# Patient Record
Sex: Male | Born: 1980 | Race: White | Hispanic: No | Marital: Single | Smoking: Never smoker
Health system: Southern US, Community
[De-identification: ages and names within clinical notes are randomized; demographics above are authoritative.]

## PROBLEM LIST (undated history)

## (undated) DIAGNOSIS — F909 Attention-deficit hyperactivity disorder, unspecified type: Secondary | ICD-10-CM

## (undated) DIAGNOSIS — M5136 Other intervertebral disc degeneration, lumbar region: Secondary | ICD-10-CM

## (undated) DIAGNOSIS — M51369 Other intervertebral disc degeneration, lumbar region without mention of lumbar back pain or lower extremity pain: Secondary | ICD-10-CM

## (undated) HISTORY — PX: TONSILLECTOMY: SHX5217

## (undated) HISTORY — DX: Attention-deficit hyperactivity disorder, unspecified type: F90.9

## (undated) HISTORY — DX: Other intervertebral disc degeneration, lumbar region without mention of lumbar back pain or lower extremity pain: M51.369

## (undated) HISTORY — DX: Other intervertebral disc degeneration, lumbar region: M51.36

## (undated) HISTORY — PX: SHOULDER ARTHROSCOPY: SHX128

---

## 2009-02-17 ENCOUNTER — Emergency Department: Payer: Self-pay | Admitting: Emergency Medicine

## 2009-08-24 ENCOUNTER — Emergency Department: Payer: Self-pay | Admitting: Emergency Medicine

## 2010-09-20 IMAGING — US ABDOMEN ULTRASOUND
1 series · 17 of 25 positions shown · non-contrast
Comparison: none

REASON FOR EXAM: EPIGASTRIC PAIN
COMMENTS:

PROCEDURE:     US  - US ABDOMEN GENERAL SURVEY  - August 24, 2009  [DATE]
RESULT:     Comparison: None
TECHNIQUE: Multiple gray-scale and color-flow Doppler images of the abdomen
are presented for review.

[Series 1: abdomen ultrasound · 17 of 57 slices shown]
[im 1/57]
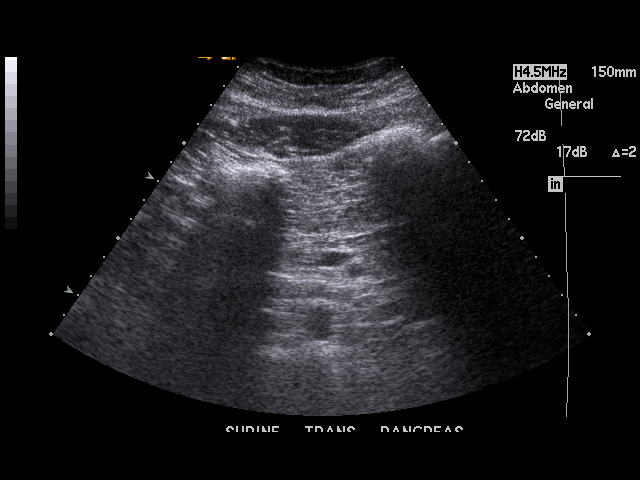
[im 5/57]
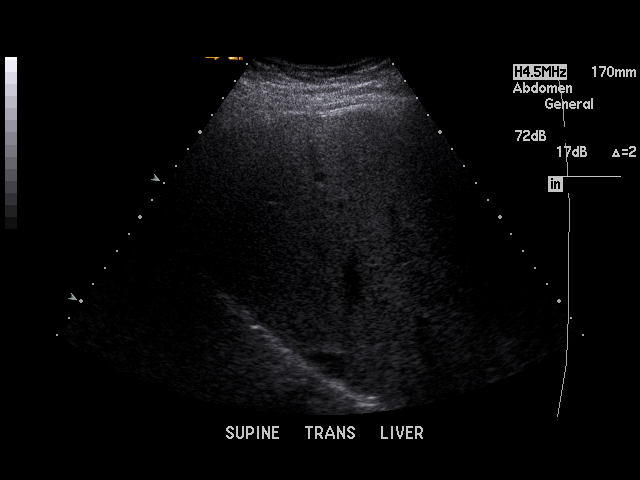
[im 8/57]
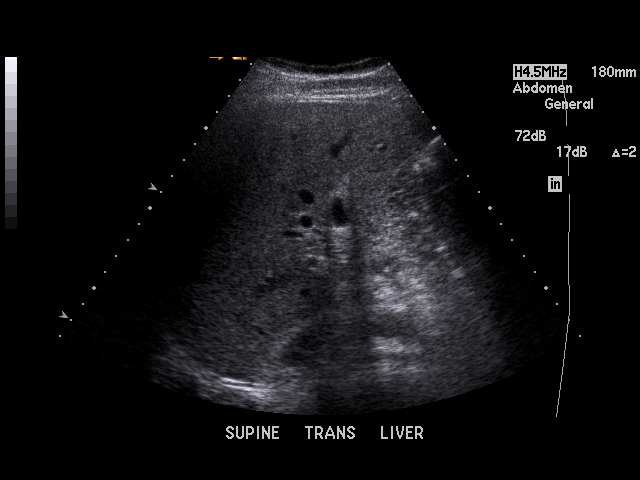
[im 12/57]
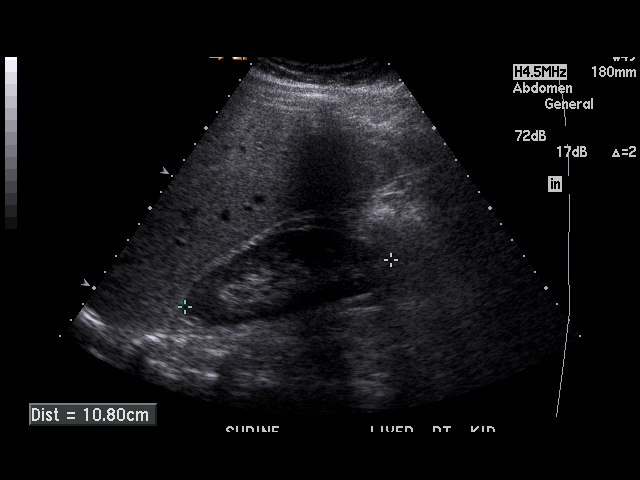
[im 15/57]
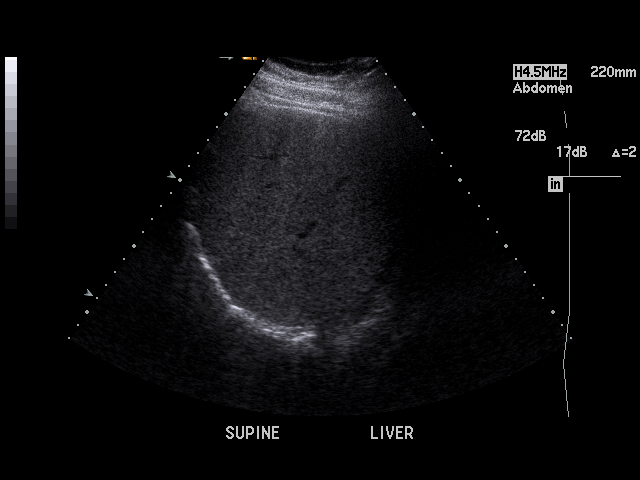
[im 19/57]
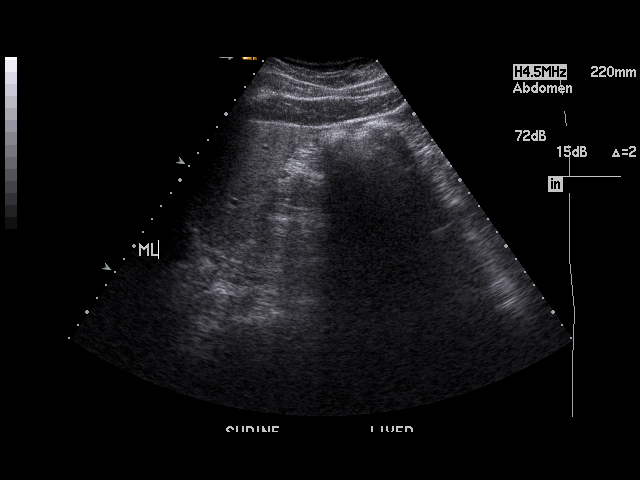
[im 22/57]
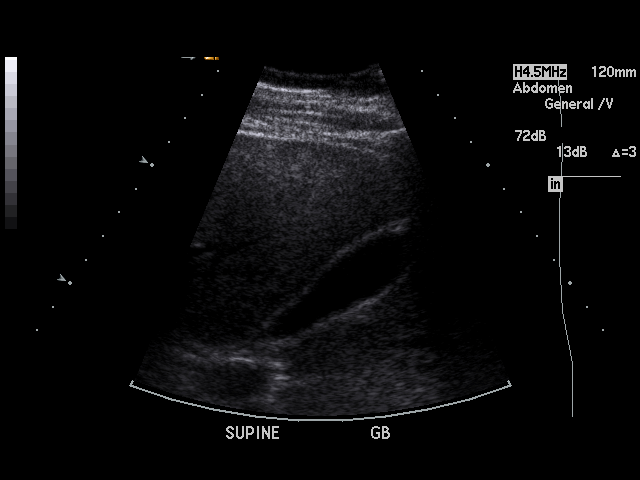
[im 26/57]
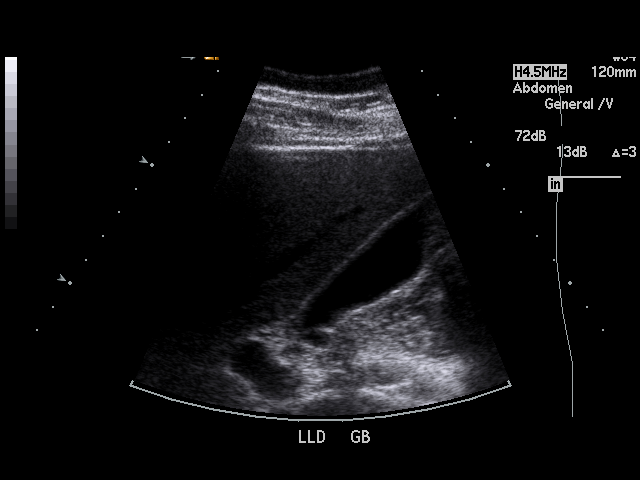
[im 29/57]
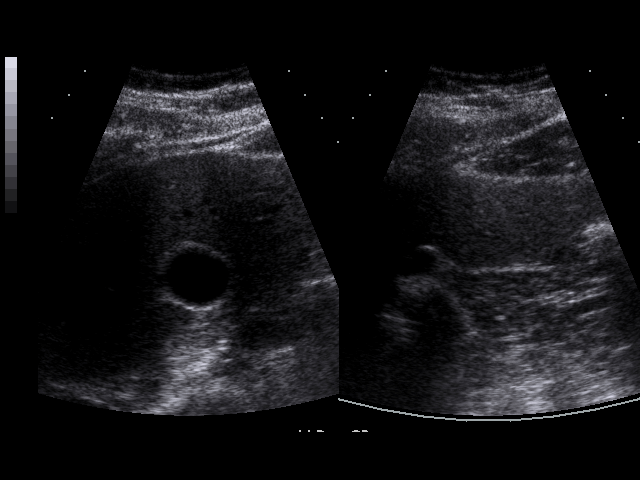
[im 31/57]
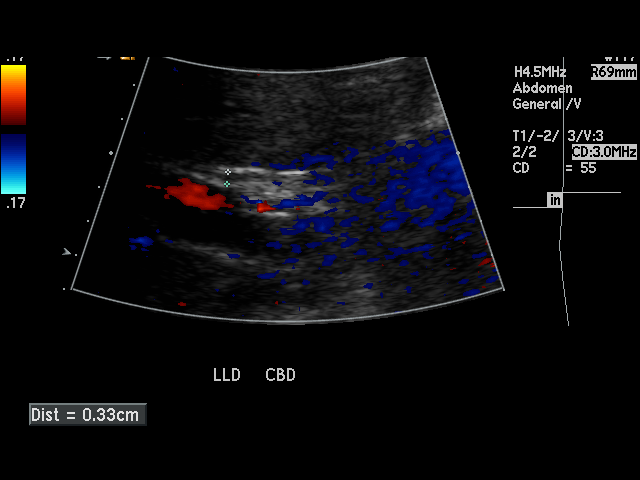
[im 36/57]
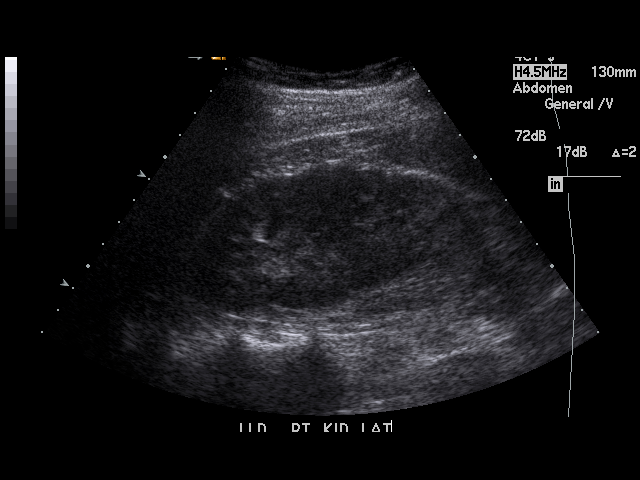
[im 38/57]
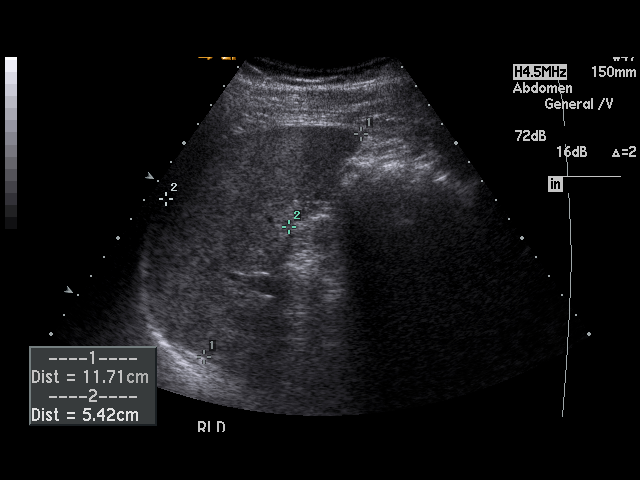
[im 43/57]
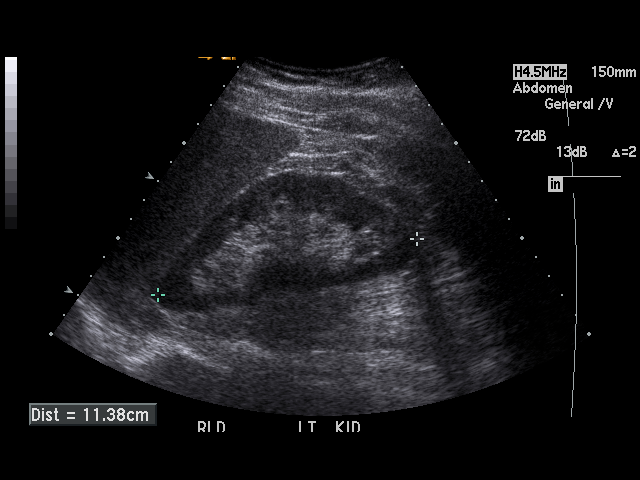
[im 45/57]
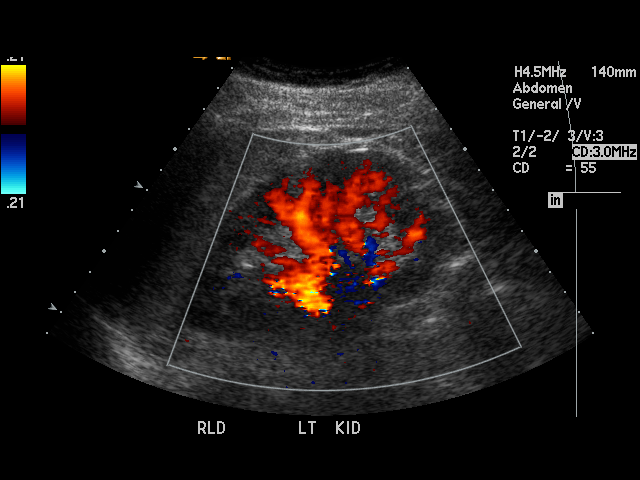
[im 50/57]
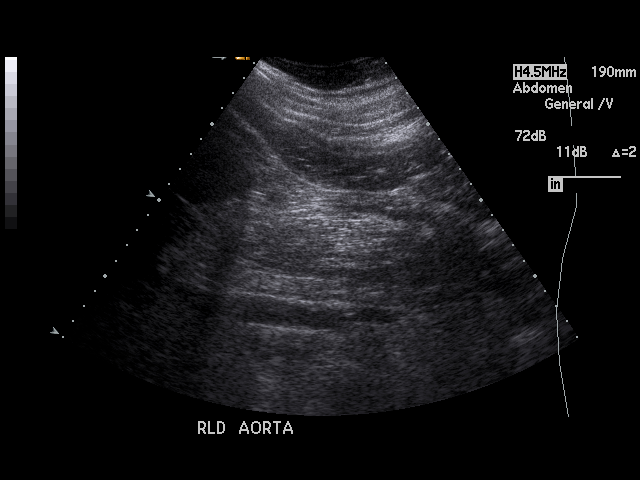
[im 52/57]
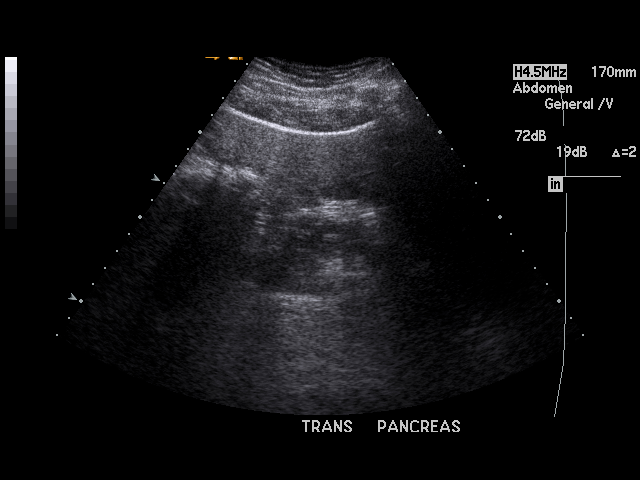
[im 57/57]
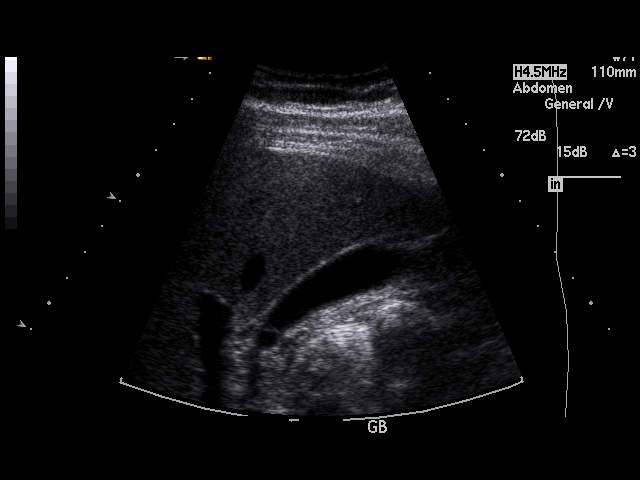

[17 of 25 positions shown; findings below may reference images not displayed]

FINDINGS: Visualized portions of the liver demonstrate normal echogenicity and normal
contours. The liver is without evidence of a focal hepatic lesion.

There is no cholelithiasis or biliary sludge. There is no intra- or
extrahepatic biliary ductal dilatation. The common duct measures 3.3 mm in
maximal diameter. There is no gallbladder wall thickening, pericholecystic
fluid, or sonographic Murphy's sign.

The visualized portion of the pancreas is normal in echogenicity. The spleen
is unremarkable. Bilateral kidneys are normal in echogenicity and size. The
right kidney measures 11.5 cm. The left kidney measures 11.4 cm. There are
no renal calculi or hydronephrosis. The abdominal aorta and IVC are
unremarkable.
IMPRESSION: Normal abdominal ultrasound.

## 2021-10-27 ENCOUNTER — Other Ambulatory Visit: Payer: Self-pay

## 2021-10-27 ENCOUNTER — Ambulatory Visit: Payer: Managed Care, Other (non HMO) | Admitting: Podiatry

## 2021-10-27 ENCOUNTER — Ambulatory Visit (INDEPENDENT_AMBULATORY_CARE_PROVIDER_SITE_OTHER): Payer: Managed Care, Other (non HMO)

## 2021-10-27 DIAGNOSIS — M67472 Ganglion, left ankle and foot: Secondary | ICD-10-CM
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
## 2021-11-06 ENCOUNTER — Telehealth: Payer: Self-pay | Admitting: *Deleted

## 2021-11-06 NOTE — Telephone Encounter (Addendum)
"  Can you email me a copy of my x-rays?"  We cannot email you copies of your x-rays.  We can make a disk of your x-rays.  You will have to pick it up from the office.  It's a $5 charge and it's a 10 - 14 day turn around time for that.  "Okay, that's fine.  Will you send me an email or call me when those are ready?"  Yes, I will give you a call. ?

## 2021-11-06 NOTE — Telephone Encounter (Incomplete Revision)
"  Can you email me a copy of my x-rays?"  We cannot email you copies of your x-rays.  We can make a disk of your x-rays.  You will have to pick it up from the office.  It's a $5 charge and it's a 10 - 14 day turn around time for that.  "Okay, that's fine.  Will you send me an email or call me when those are ready?"  Yes, I will give you a call. ?

## 2021-11-18 NOTE — Telephone Encounter (Signed)
"  I'm calling to see if my x-ray disk is ready."  I'm working on it now.  "So I can pick it up this afternoon?"  Yes, that is correct.  You can pick it up today. ?

## 2022-04-10 NOTE — Patient Instructions (Signed)

## 2022-04-14 ENCOUNTER — Ambulatory Visit (INDEPENDENT_AMBULATORY_CARE_PROVIDER_SITE_OTHER): Payer: Self-pay | Admitting: Nurse Practitioner

## 2022-04-14 ENCOUNTER — Encounter: Payer: Self-pay | Admitting: Nurse Practitioner

## 2022-04-14 VITALS — BP 109/64 | HR 73 | Temp 98.4°F | Ht 74.0 in | Wt 249.2 lb

## 2022-04-14 DIAGNOSIS — Z0289 Encounter for other administrative examinations: Secondary | ICD-10-CM

## 2022-04-14 LAB — URINALYSIS, DIPSTICK ONLY
Bilirubin, UA: NEGATIVE
Glucose, UA: NEGATIVE
Ketones, UA: NEGATIVE
Leukocytes,UA: NEGATIVE
Nitrite, UA: NEGATIVE
Protein,UA: NEGATIVE
RBC, UA: NEGATIVE
Specific Gravity, UA: 1.025 (ref 1.005–1.030)
Urobilinogen, Ur: 0.2 mg/dL (ref 0.2–1.0)
pH, UA: 6.5 (ref 5.0–7.5)

## 2022-04-14 NOTE — Progress Notes (Signed)
BP 109/64   Pulse 73   Temp 98.4 F (36.9 C) (Oral)   Ht 6\' 2"  (1.88 m)   Wt 249 lb 3.2 oz (113 kg)   SpO2 96%   BMI 32.00 kg/m    Subjective:    Patient ID: Marcus Hall, male    DOB: May 17, 1981, 41 y.o.   MRN: [id number]  HPI: Eliceo Gladu is a 41 y.o. male presenting on 04/14/2022 for DOT examination.  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  Has had one DOT in past.  [place].  Functional Status Survey: Is the patient deaf or have difficulty hearing?: No Does the patient have difficulty seeing, even when wearing glasses/contacts?: No Does the patient have difficulty concentrating, remembering, or making decisions?: No Does the patient have difficulty walking or climbing stairs?: No Does the patient have difficulty dressing or bathing?: No Does the patient have difficulty doing errands alone such as visiting a doctor's office or shopping?: No  FALL RISK:    04/14/2022    3:47 PM  Fall Risk   Falls in the past year? 0  Number falls in past yr: 0  Injury with Fall? 0  Risk for fall due to : No Fall Risks  Follow up Falls prevention discussed   Past Medical History:  Past Medical History:  Diagnosis Date   Disc degeneration, lumbar     Surgical History:  Past Surgical History:  Procedure Laterality Date   SHOULDER ARTHROSCOPY Right    TONSILLECTOMY      Medications:  No current outpatient medications on file prior to visit.   No current facility-administered medications on file prior to visit.    Allergies:  Allergies  Allergen Reactions   Prednisone Nausea And Vomiting    Social History:  Social History   Socioeconomic History   Marital status: Single    Spouse name: Not on file   Number of children: Not on file   Years of education: Not on file   Highest education level: Not on file  Occupational History   Not on file  Tobacco Use    Smoking status: Never   Smokeless tobacco: Never  Substance and Sexual Activity   Alcohol use: Never   Drug use: Never   Sexual activity: Not Currently  Other Topics Concern   Not on file  Social History Narrative   Not on file   Social Determinants of Health   Financial Resource Strain: Not on file  Food Insecurity: Not on file  Transportation Needs: Not on file  Physical Activity: Not on file  Stress: Not on file  Social Connections: Not on file  Intimate Partner Violence: Not on file   Social History   Tobacco Use  Smoking Status Never  Smokeless Tobacco Never   Social History   Substance and Sexual Activity  Alcohol Use Never    Family History:  Family History  Problem Relation Age of Onset   Heart Problems Mother    Diabetes Father    Healthy Sister    Healthy Sister    Pancreatic cancer Maternal Grandfather    Heart attack Paternal Grandfather     Past medical history, surgical history, medications, allergies, family history and social history reviewed with patient today and changes made to appropriate areas of the chart.   ROS All other ROS negative except what is listed above and in the HPI.  Objective:    BP 109/64   Pulse 73   Temp 98.4 F (36.9 C) (Oral)   Ht 6\' 2"  (1.88 m)   Wt 249 lb 3.2 oz (113 kg)   SpO2 96%   BMI 32.00 kg/m   Wt Readings from Last 3 Encounters:  04/14/22 249 lb 3.2 oz (113 kg)    Vision Screening   Right eye Left eye Both eyes  Without correction 20/20 20/20 20/20   With correction      Physical Exam Vitals and nursing note reviewed.  Constitutional:      General: He is awake. He is not in acute distress.    Appearance: He is well-developed and well-groomed. He is obese. He is not ill-appearing or toxic-appearing.  HENT:     Head: Normocephalic and atraumatic.     Right Ear: Hearing, tympanic membrane, ear canal and external ear normal. No drainage.     Left Ear: Hearing, tympanic membrane, ear canal and  external ear normal. No drainage.     Ears:     Comments: Whisper testing passed bilaterally at 6 feet.    Nose: Nose normal.     Mouth/Throat:     Pharynx: Uvula midline.  Eyes:     General: Lids are normal.        Right eye: No discharge.        Left eye: No discharge.     Extraocular Movements: Extraocular movements intact.     Conjunctiva/sclera: Conjunctivae normal.     Pupils: Pupils are equal, round, and reactive to light.     Visual Fields: Right eye visual fields normal and left eye visual fields normal.     Comments: 70 degrees visual fields present  Neck:     Thyroid: No thyromegaly.     Vascular: No carotid bruit or JVD.     Trachea: Trachea normal.  Cardiovascular:     Rate and Rhythm: Normal rate and regular rhythm.     Heart sounds: Normal heart sounds, S1 normal and S2 normal. No murmur heard.    No gallop.  Pulmonary:     Effort: Pulmonary effort is normal. No accessory muscle usage or respiratory distress.     Breath sounds: Normal breath sounds.  Abdominal:     General: Bowel sounds are normal.     Palpations: Abdomen is soft. There is no hepatomegaly or splenomegaly.     Tenderness: There is no abdominal tenderness.  Musculoskeletal:        General: Normal range of motion.     Cervical back: Normal range of motion and neck supple.     Right lower leg: No edema.     Left lower leg: No edema.     Comments: Strength 5/5 upper and lower extremities bilaterally.  Lymphadenopathy:     Head:     Right side of head: No submental, submandibular, tonsillar, preauricular or posterior auricular adenopathy.     Left side of head: No submental, submandibular, tonsillar, preauricular or posterior auricular adenopathy.     Cervical: No cervical adenopathy.  Skin:    General: Skin is warm and dry.     Capillary Refill: Capillary refill takes less than 2 seconds.     Findings: No rash.  Neurological:     Mental Status: He is alert and oriented to person, place, and  time.     Gait: Gait is intact.     Deep Tendon Reflexes: Reflexes are normal and symmetric.  Reflex Scores:      Brachioradialis reflexes are 2+ on the right side and 2+ on the left side.      Patellar reflexes are 2+ on the right side and 2+ on the left side. Psychiatric:        Attention and Perception: Attention normal.        Mood and Affect: Mood normal.        Speech: Speech normal.        Behavior: Behavior normal. Behavior is cooperative.        Thought Content: Thought content normal.        Cognition and Memory: Cognition normal.    Assessment & Plan:   Problem List Items Addressed This Visit   None Visit Diagnoses     Encounter for examination required by Department of Transportation (DOT)    -  Primary   Overall normal physical with DOT -- provided 2 year certification which will expire 04/14/2024.   Relevant Orders   Urinalysis, dipstick only (Completed)        Follow up plan: NEXT PREVENTATIVE PHYSICAL DUE IN 1 YEAR. Return if symptoms worsen or fail to improve.

## 2022-07-14 ENCOUNTER — Ambulatory Visit: Payer: Self-pay | Admitting: Family Medicine

## 2022-08-25 ENCOUNTER — Ambulatory Visit: Payer: Self-pay | Admitting: Family Medicine

## 2022-10-25 ENCOUNTER — Ambulatory Visit: Payer: Self-pay | Admitting: Family Medicine

## 2024-03-01 ENCOUNTER — Ambulatory Visit (INDEPENDENT_AMBULATORY_CARE_PROVIDER_SITE_OTHER): Payer: Self-pay

## 2024-03-01 ENCOUNTER — Other Ambulatory Visit: Payer: Self-pay

## 2024-03-01 VITALS — BP 112/68 | HR 68 | Ht 74.0 in | Wt 254.0 lb

## 2024-03-01 DIAGNOSIS — Z131 Encounter for screening for diabetes mellitus: Secondary | ICD-10-CM

## 2024-03-01 DIAGNOSIS — E669 Obesity, unspecified: Secondary | ICD-10-CM

## 2024-03-01 DIAGNOSIS — M545 Low back pain, unspecified: Secondary | ICD-10-CM

## 2024-03-01 DIAGNOSIS — Z113 Encounter for screening for infections with a predominantly sexual mode of transmission: Secondary | ICD-10-CM

## 2024-03-01 DIAGNOSIS — G8929 Other chronic pain: Secondary | ICD-10-CM

## 2024-03-01 DIAGNOSIS — M549 Dorsalgia, unspecified: Secondary | ICD-10-CM | POA: Insufficient documentation
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
## 2024-03-01 NOTE — Assessment & Plan Note (Signed)
 Patient currently managing his symptoms conservatively.  Will continue with current treatment regime.  Follow-up if other issues.  Advised regular conditioning for back and regular physical activity.

## 2024-03-01 NOTE — Assessment & Plan Note (Signed)
  Discussed overall diet and exercise.  It does seem that he eats fairly healthy.  Will check lipid panel and basic labs otherwise.  Hemoglobin A1c with labs as well.

## 2024-03-01 NOTE — Addendum Note (Signed)
 Addended by: Mardell Suttles M on: 03/01/2024 03:08 PM   Modules accepted: Orders

## 2024-03-02 ENCOUNTER — Ambulatory Visit: Payer: Self-pay

## 2024-03-07 LAB — CBC WITH DIFFERENTIAL/PLATELET
Absolute Lymphocytes: 2360 {cells}/uL (ref 850–3900)
Absolute Monocytes: 348 {cells}/uL (ref 200–950)
Basophils Absolute: 18 {cells}/uL (ref 0–200)
Basophils Relative: 0.3 %
Eosinophils Absolute: 59 {cells}/uL (ref 15–500)
Eosinophils Relative: 1 %
HCT: 47.5 % (ref 38.5–50.0)
Hemoglobin: 15.9 g/dL (ref 13.2–17.1)
MCH: 29.2 pg (ref 27.0–33.0)
MCHC: 33.5 g/dL (ref 32.0–36.0)
MCV: 87.2 fL (ref 80.0–100.0)
MPV: 9.6 fL (ref 7.5–12.5)
Monocytes Relative: 5.9 %
Neutro Abs: 3115 {cells}/uL (ref 1500–7800)
Neutrophils Relative %: 52.8 %
Platelets: 234 Thousand/uL (ref 140–400)
RBC: 5.45 Million/uL (ref 4.20–5.80)
RDW: 13.2 % (ref 11.0–15.0)
Total Lymphocyte: 40 %
WBC: 5.9 Thousand/uL (ref 3.8–10.8)

## 2024-03-07 LAB — IRON,TIBC AND FERRITIN PANEL
%SAT: 30 % (ref 20–48)
Ferritin: 183 ng/mL (ref 38–380)
Iron: 105 ug/dL (ref 50–180)
TIBC: 345 ug/dL (ref 250–425)

## 2024-03-07 LAB — HCV RNA, QUANT REAL-TIME PCR W/REFLEX
HCV RNA, PCR, QN (Log): <1.18 {Log_IU}/mL
HCV RNA, PCR, QN: <15 [IU]/mL

## 2024-03-07 LAB — HEMOGLOBIN A1C
Hgb A1c MFr Bld: 5.3 % (ref <5.7)
Mean Plasma Glucose: 105 mg/dL
eAG (mmol/L): 5.8 mmol/L

## 2024-03-07 LAB — RPR: RPR Ser Ql: NONREACTIVE

## 2024-03-07 LAB — TSH+FREE T4: TSH W/REFLEX TO FT4: 1.54 m[IU]/L (ref 0.40–4.50)

## 2024-03-07 LAB — URINALYSIS, ROUTINE W REFLEX MICROSCOPIC
Bilirubin Urine: NEGATIVE
Glucose, UA: NEGATIVE
Hgb urine dipstick: NEGATIVE
Ketones, ur: NEGATIVE
Leukocytes,Ua: NEGATIVE
Nitrite: NEGATIVE
Protein, ur: NEGATIVE
Specific Gravity, Urine: 1.021 (ref 1.001–1.035)
pH: 6.5 (ref 5.0–8.0)

## 2024-03-07 LAB — HIV ANTIBODY (ROUTINE TESTING W REFLEX): HIV 1&2 Ab, 4th Generation: NONREACTIVE

## 2024-03-07 LAB — LIPOPROTEIN A (LPA): Lipoprotein (a): 15 nmol/L (ref <75)

## 2024-03-09 ENCOUNTER — Ambulatory Visit (INDEPENDENT_AMBULATORY_CARE_PROVIDER_SITE_OTHER): Payer: Self-pay | Admitting: Internal Medicine

## 2024-03-09 ENCOUNTER — Encounter: Payer: Self-pay | Admitting: Internal Medicine

## 2024-03-09 VITALS — BP 110/72 | Ht 74.0 in | Wt 255.5 lb

## 2024-03-09 DIAGNOSIS — Z024 Encounter for examination for driving license: Secondary | ICD-10-CM

## 2024-03-09 NOTE — Progress Notes (Signed)
 Airline pilot Medical Examination   Marcus Hall is a 43 y.o. male who presents today for a commercial driver fitness determination physical exam. The patient reports no problems. The following portions of the patient's history were reviewed and updated as appropriate: allergies, current medications, past family history, past medical history, past social history, past surgical history, and problem list.  Review of Systems  Past Medical History:  Diagnosis Date   ADHD    Disc degeneration, lumbar     No current outpatient medications on file.   No current facility-administered medications for this visit.    Allergies  Allergen Reactions   Prednisone Nausea And Vomiting    Family History  Problem Relation Age of Onset   Heart Problems Mother    Diabetes Father    Healthy Sister    Healthy Sister    Pancreatic cancer Maternal Grandfather    Heart attack Paternal Grandfather     Social History   Socioeconomic History   Marital status: Single    Spouse name: Not on file   Number of children: Not on file   Years of education: Not on file   Highest education level: Not on file  Occupational History   Not on file  Tobacco Use   Smoking status: Never   Smokeless tobacco: Never  Vaping Use   Vaping status: Never Used  Substance and Sexual Activity   Alcohol use: Yes   Drug use: Never   Sexual activity: Not Currently  Other Topics Concern   Not on file  Social History Narrative   Not on file   Social Drivers of Health   Financial Resource Strain: Not on file  Food Insecurity: Not on file  Transportation Needs: Not on file  Physical Activity: Not on file  Stress: Not on file  Social Connections: Not on file  Intimate Partner Violence: Not on file     Constitutional: Denies fever, malaise, fatigue, headache or abrupt weight changes.  HEENT: Denies eye pain, eye redness, ear pain, ringing in the ears, wax buildup, runny nose, nasal congestion, bloody nose,  or sore throat. Respiratory: Denies difficulty breathing, shortness of breath, cough or sputum production.   Cardiovascular: Denies chest pain, chest tightness, palpitations or swelling in the hands or feet.  Gastrointestinal: Denies abdominal pain, bloating, constipation, diarrhea or blood in the stool.  GU: Denies urgency, frequency, pain with urination, burning sensation, blood in urine, odor or discharge. Musculoskeletal: Denies decrease in range of motion, difficulty with gait, muscle pain or joint pain and swelling.  Skin: Denies redness, rashes, lesions or ulcercations.  Neurological: Denies dizziness, difficulty with memory, difficulty with speech or problems with balance and coordination.  Psych: Denies anxiety, depression, SI/HI.  No other specific complaints in a complete review of systems (except as listed in HPI above).   Objective:    Vision:  Uncorrected Corrected Horizontal Field of Vision  Right Eye  20/20 70 degrees  Left Eye   20/20 70 degrees  Both Eyes   20/20    Applicant can recognize and distinguish among traffic control signals and devices showing standard red, green, and amber colors.     Monocular Vision?: no   Hearing:  Right Ear: > 5 ft Left Ear: > 5 ft    BP 110/72 (BP Location: Left Arm, Patient Position: Sitting, Cuff Size: Normal)   Ht 6' 2 (1.88 m)   Wt 255 lb 8 oz (115.9 kg)   BMI 32.80 kg/m   Wt Readings from  Last 3 Encounters:  03/01/24 254 lb (115.2 kg)  04/14/22 249 lb 3.2 oz (113 kg)    General: Appears his stated age, obese, in NAD. Skin: Warm, dry and intact. No rashes, lesions or ulcerations noted. HEENT: Head: normal shape and size; Eyes: sclera white, no icterus, conjunctiva pink, PERRLA and EOMs intact; Ears: Tm's gray and intact, normal light reflex; Nose: mucosa pink and moist, septum midline; Throat/Mouth: Teeth present, mucosa pink and moist, no exudate, lesions or ulcerations noted.  Neck:  Neck supple, trachea  midline. No masses, lumps or thyromegaly present.  Cardiovascular: Normal rate and rhythm. S1,S2 noted.  No murmur, rubs or gallops noted. No JVD or BLE edema.  Pulmonary/Chest: Normal effort and positive vesicular breath sounds. No respiratory distress. No wheezes, rales or ronchi noted.  Abdomen: Soft and nontender. Normal bowel sounds. No distention or masses noted. Liver, spleen and kidneys non palpable. Musculoskeletal: Normal range of motion. Strength 5/5 BUE/BLE. No difficulty with gait.  Neurological: Alert and oriented. Cranial nerves II-XII grossly intact. Coordination normal.  Psychiatric: Mood and affect normal. Behavior is normal. Judgment and thought content normal.    CBC    Component Value Date/Time   WBC 5.9 03/01/2024 1524   RBC 5.45 03/01/2024 1524   HGB 15.9 03/01/2024 1524   HCT 47.5 03/01/2024 1524   PLT 234 03/01/2024 1524   MCV 87.2 03/01/2024 1524   MCH 29.2 03/01/2024 1524   MCHC 33.5 03/01/2024 1524   RDW 13.2 03/01/2024 1524   EOSABS 59 03/01/2024 1524   BASOSABS 18 03/01/2024 1524    Hgb A1C Lab Results  Component Value Date   HGBA1C 5.3 03/01/2024        Labs: Lab Results  Component Value Date   SPECGRAV 1.025 04/14/2022   PROTEINUR NEGATIVE 03/01/2024   BILIRUBINUR Negative 04/14/2022   GLUCOSEU NEGATIVE 03/01/2024      Assessment:    Healthy male exam.  Meets standards in 38 CFR 391.41;  qualifies for 2 year certificate.    Plan:    Medical examiners certificate completed and printed. Return as needed.   Angeline Laura, NP

## 2024-04-05 ENCOUNTER — Ambulatory Visit: Payer: Self-pay

## 2024-04-12 ENCOUNTER — Ambulatory Visit (INDEPENDENT_AMBULATORY_CARE_PROVIDER_SITE_OTHER): Payer: Self-pay

## 2024-04-12 VITALS — BP 118/70 | HR 70 | Ht 74.0 in | Wt 256.5 lb

## 2024-04-12 DIAGNOSIS — K649 Unspecified hemorrhoids: Secondary | ICD-10-CM | POA: Insufficient documentation

## 2024-04-12 DIAGNOSIS — M545 Low back pain, unspecified: Secondary | ICD-10-CM

## 2024-04-12 DIAGNOSIS — E669 Obesity, unspecified: Secondary | ICD-10-CM

## 2024-04-12 DIAGNOSIS — G8929 Other chronic pain: Secondary | ICD-10-CM

## 2024-04-12 NOTE — Progress Notes (Signed)
 Progress Note  Physician: Parris DELENA Juneau, MD   HPI: Marcus Hall is a 43 y.o. male presenting on 04/12/2024 for Follow-up .  Discussed the use of AI scribe software for clinical note transcription with the patient, who gave verbal consent to proceed.  History of Present Illness   Marcus Hall is a 43 year old male who presents for a routine follow-up and review of lab results.  Low back pain - Occasional back pain due to collapsed discs at L5 and S1 - Pain exacerbated by heavy lifting or prolonged sitting - Overexertion alters gait and causes cramping - Uses inversion table for symptomatic relief - Avoids corticosteroid injections  Hemorrhoidal symptoms - Persistent hemorrhoid with intermittent flares, worsened by prolonged sitting - Manages symptoms by 'tucking' and using ice packs - Avoids suppositories unless necessary  Concerns regarding human papillomavirus (hpv) - No current symptoms of HPV infection but partner had.  Subtype unknown  Recurrent sinus infections - History of sinus infections, possibly related to local allergens     Medical history:  Relevant past medical, surgical, family and social history reviewed and updated as indicated. Interim medical history since our last visit reviewed.  Allergies and medications reviewed and updated.   ROS: Negative unless specifically indicated above in HPI.   No current outpatient medications on file.       Objective:     BP 118/70 (BP Location: Left Arm, Patient Position: Sitting, Cuff Size: Normal)   Pulse 70   Ht 6' 2 (1.88 m)   Wt 256 lb 8 oz (116.3 kg)   SpO2 97%   BMI 32.93 kg/m   Wt Readings from Last 3 Encounters:  04/12/24 256 lb 8 oz (116.3 kg)  03/09/24 255 lb 8 oz (115.9 kg)  03/01/24 254 lb (115.2 kg)    Physical Exam  Physical Exam Vitals reviewed.  Constitutional:      Appearance: Normal appearance. Well-developed with normal weight.  Cardiovascular:     Rate and  Rhythm: Normal rate and regular rhythm. Normal heart sounds. Normal peripheral pulses Pulmonary:     Normal breath sounds with normal effort Skin:    General: Skin is warm and dry without noticeable rash. Neurological:     General: No focal deficit present.  Psychiatric:        Mood and Affect: Mood, behavior and cognition normal      Assessment & Plan:   Encounter Diagnoses  Name Primary?   Mildly obese Yes    No orders of the defined types were placed in this encounter.    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  Inversion table provides significant relief, allowing for normal daily activities. - Continue use of inversion table as needed for symptom relief. - Avoid activities that exacerbate back pain, such as heavy lifting or improper lifting techniques.  Obesity - BMI 32 Desire to lose weight to improve comfort and potentially increase time on the inversion table. Current weight management includes avoiding overeating and junk food, and maintaining a healthy diet. Aware of the need for increased physical activity and dietary modifications. - Encourage weight loss through dietary modifications and increased physical activity. - Advise on incorporating weight training and high-intensity exercises as feasible.  Recurrent hemorrhoids Avoids surgical intervention due to concerns about recurrence post-surgery. Aware of the need to avoid constipation and straining  during bowel movements. - Continue manual reduction and hygiene practices as needed. - Avoid constipation and straining during bowel movements.  Recurrent acute sinusitis Prone to sinus infections, likely due to environmental allergens. No current symptoms. Management includes saline rinses and ibuprofen for discomfort. Aware of the need to monitor for worsening symptoms such as fever and purulent drainage. - Use saline rinses  and ibuprofen at the onset of symptoms. - Contact provider if symptoms worsen or if fever and purulent drainage develop.      Labs generally unremarkable although we are missing some labs that were not done for some reason including GC chlamydia and lipid panel with CMP.  Plan to run labs annually and so we can include this if desired in the next panel.  Patient LPA negative which is a positive outcome for cardiovascular risk.  Otherwise patient is self-pay plan to follow-up as needed.

## 2024-04-13 NOTE — Progress Notes (Signed)
 Appointment scheduled patient will get a mychart message

## 2025-04-15 ENCOUNTER — Other Ambulatory Visit: Payer: Self-pay
